# Patient Record
Sex: Male | Born: 1990 | Race: White | Hispanic: No | Marital: Single | Smoking: Never smoker
Health system: Southern US, Community
[De-identification: ages and names within clinical notes are randomized; demographics above are authoritative.]

---
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
## 2014-01-11 ENCOUNTER — Emergency Department (HOSPITAL_COMMUNITY)
Admission: EM | Admit: 2014-01-11 | Discharge: 2014-01-11 | Disposition: A | Discharge status: Home / Self Care | Payer: No Typology Code available for payment source | Attending: Emergency Medicine | Admitting: Emergency Medicine

## 2014-01-11 ENCOUNTER — Encounter (HOSPITAL_COMMUNITY): Payer: Self-pay | Admitting: Emergency Medicine

## 2014-01-11 ENCOUNTER — Emergency Department (HOSPITAL_COMMUNITY): Payer: No Typology Code available for payment source

## 2014-01-11 DIAGNOSIS — S161XXA Strain of muscle, fascia and tendon at neck level, initial encounter: Secondary | ICD-10-CM

## 2014-01-11 DIAGNOSIS — S139XXA Sprain of joints and ligaments of unspecified parts of neck, initial encounter: Secondary | ICD-10-CM | POA: Insufficient documentation

## 2014-01-11 DIAGNOSIS — Y92410 Unspecified street and highway as the place of occurrence of the external cause: Secondary | ICD-10-CM | POA: Insufficient documentation

## 2014-01-11 DIAGNOSIS — Y9389 Activity, other specified: Secondary | ICD-10-CM | POA: Insufficient documentation

## 2014-01-11 MED ORDER — CYCLOBENZAPRINE HCL 10 MG PO TABS: 10.0000 mg | ORAL_TABLET | Freq: Two times a day (BID) | ORAL | Status: AC | PRN

## 2014-01-11 MED ORDER — IBUPROFEN 800 MG PO TABS: 800.0000 mg | ORAL_TABLET | Freq: Three times a day (TID) | ORAL | Status: AC
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
## 2014-01-11 NOTE — Discharge Instructions (Signed)
Cervical Sprain  A cervical sprain is an injury in the neck in which the strong, fibrous tissues (ligaments) that connect your neck bones stretch or tear. Cervical sprains can range from mild to severe. Severe cervical sprains can cause the neck vertebrae to be unstable. This can lead to damage of the spinal cord and can result in serious nervous system problems. The amount of time it takes for a cervical sprain to get better depends on the cause and extent of the injury. Most cervical sprains heal in 1 to 3 weeks.  CAUSES   Severe cervical sprains may be caused by:   · Contact sport injuries (such as from football, rugby, wrestling, hockey, auto racing, gymnastics, diving, martial arts, or boxing).    · Motor vehicle collisions.    · Whiplash injuries. This is an injury from a sudden forward-and backward whipping movement of the head and neck.   · Falls.    Mild cervical sprains may be caused by:   · Being in an awkward position, such as while cradling a telephone between your ear and shoulder.    · Sitting in a chair that does not offer proper support.    · Working at a poorly designed computer station.    · Looking up or down for long periods of time.    SYMPTOMS   · Pain, soreness, stiffness, or a burning sensation in the front, back, or sides of the neck. This discomfort may develop immediately after the injury or slowly, 24 hours or more after the injury.    · Pain or tenderness directly in the middle of the back of the neck.    · Shoulder or upper back pain.    · Limited ability to move the neck.    · Headache.    · Dizziness.    · Weakness, numbness, or tingling in the hands or arms.    · Muscle spasms.    · Difficulty swallowing or chewing.    · Tenderness and swelling of the neck.    DIAGNOSIS   Most of the time your health care provider can diagnose a cervical sprain by taking your history and doing a physical exam. Your health care provider will ask about previous neck injuries and any known neck  problems, such as arthritis in the neck. X-rays may be taken to find out if there are any other problems, such as with the bones of the neck. Other tests, such as a CT scan or MRI, may also be needed.   TREATMENT   Treatment depends on the severity of the cervical sprain. Mild sprains can be treated with rest, keeping the neck in place (immobilization), and pain medicines. Severe cervical sprains are immediately immobilized. Further treatment is done to help with pain, muscle spasms, and other symptoms and may include:  · Medicines, such as pain relievers, numbing medicines, or muscle relaxants.    · Physical therapy. This may involve stretching exercises, strengthening exercises, and posture training. Exercises and improved posture can help stabilize the neck, strengthen muscles, and help stop symptoms from returning.    HOME CARE INSTRUCTIONS   · Put ice on the injured area.    · Put ice in a plastic bag.    · Place a towel between your skin and the bag.    · Leave the ice on for 15 20 minutes, 3 4 times a day.    · If your injury was severe, you may have been given a cervical collar to wear. A cervical collar is a two-piece collar designed to keep your neck from moving while it heals.  · Do   not remove the collar unless instructed by your health care provider.  · If you have long hair, keep it outside of the collar.  · Ask your health care provider before making any adjustments to your collar. Minor adjustments may be required over time to improve comfort and reduce pressure on your chin or on the back of your head.  · If you are allowed to remove the collar for cleaning or bathing, follow your health care provider's instructions on how to do so safely.  · Keep your collar clean by wiping it with mild soap and water and drying it completely. If the collar you have been given includes removable pads, remove them every 1 2 days and hand wash them with soap and water. Allow them to air dry. They should be completely  dry before you wear them in the collar.  · If you are allowed to remove the collar for cleaning and bathing, wash and dry the skin of your neck. Check your skin for irritation or sores. If you see any, tell your health care provider.  · Do not drive while wearing the collar.    · Only take over-the-counter or prescription medicines for pain, discomfort, or fever as directed by your health care provider.    · Keep all follow-up appointments as directed by your health care provider.    · Keep all physical therapy appointments as directed by your health care provider.    · Make any needed adjustments to your workstation to promote good posture.    · Avoid positions and activities that make your symptoms worse.    · Warm up and stretch before being active to help prevent problems.    SEEK MEDICAL CARE IF:   · Your pain is not controlled with medicine.    · You are unable to decrease your pain medicine over time as planned.    · Your activity level is not improving as expected.    SEEK IMMEDIATE MEDICAL CARE IF:   · You develop any bleeding.  · You develop stomach upset.  · You have signs of an allergic reaction to your medicine.    · Your symptoms get worse.    · You develop new, unexplained symptoms.    · You have numbness, tingling, weakness, or paralysis in any part of your body.    MAKE SURE YOU:   · Understand these instructions.  · Will watch your condition.  · Will get help right away if you are not doing well or get worse.  Document Released: 09/30/2007 Document Revised: 09/23/2013 Document Reviewed: 06/10/2013  [employer]® Patient Information ©2014 [employer].  Muscle Strain  A muscle strain is an injury that occurs when a muscle is stretched beyond its normal length. Usually a small number of muscle fibers are torn when this happens. Muscle strain is rated in degrees. First-degree strains have the least amount of muscle fiber tearing and pain. Second-degree and third-degree strains have increasingly more  tearing and pain.   Usually, recovery from muscle strain takes 1 2 weeks. Complete healing takes 5 6 weeks.   CAUSES   Muscle strain happens when a sudden, violent force placed on a muscle stretches it too far. This may occur with lifting, sports, or a fall.   RISK FACTORS  Muscle strain is especially common in athletes.   SIGNS AND SYMPTOMS  At the site of the muscle strain, there may be:  · Pain.  · Bruising.  · Swelling.  · Difficulty using the muscle due to pain or lack of normal function.  DIAGNOSIS     Your health care provider will perform a physical exam and ask about your medical history.  TREATMENT   Often, the best treatment for a muscle strain is resting, icing, and applying cold compresses to the injured area.    HOME CARE INSTRUCTIONS   · Use the PRICE method of treatment to promote muscle healing during the first 2 3 days after your injury. The PRICE method involves:  · Protecting the muscle from being injured again.  · Restricting your activity and resting the injured body part.  · Icing your injury. To do this, put ice in a plastic bag. Place a towel between your skin and the bag. Then, apply the ice and leave it on from 15 20 minutes each hour. After the third day, switch to moist heat packs.  · Apply compression to the injured area with a splint or elastic bandage. Be careful not to wrap it too tightly. This may interfere with blood circulation or increase swelling.  · Elevate the injured body part above the level of your heart as often as you can.  · Only take over-the-counter or prescription medicines for pain, discomfort, or fever as directed by your health care provider.  · Warming up prior to exercise helps to prevent future muscle strains.  SEEK MEDICAL CARE IF:   · You have increasing pain or swelling in the injured area.  · You have numbness, tingling, or a significant loss of strength in the injured area.  MAKE SURE YOU:   · Understand these instructions.  · Will watch your  condition.  · Will get help right away if you are not doing well or get worse.  Document Released: 12/03/2005 Document Revised: 09/23/2013 Document Reviewed: 07/02/2013  [employer]® Patient Information ©2014 [employer].
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
## 2014-01-11 NOTE — ED Provider Notes (Signed)
Medical screening examination/treatment/procedure(s) were performed by non-physician practitioner and as supervising physician I was immediately available for consultation/collaboration.  EKG Interpretation   None         Kristen N Ward, DO 01/11/14 2257 
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
